# Patient Record
Sex: Female | Born: 1980 | Hispanic: Yes | Marital: Married
Health system: Southern US, Community
[De-identification: ages and names within clinical notes are randomized; demographics above are authoritative.]

---

## 2008-08-07 ENCOUNTER — Ambulatory Visit: Payer: Self-pay | Admitting: Certified Nurse Midwife

## 2009-02-19 ENCOUNTER — Emergency Department: Payer: Self-pay | Admitting: Emergency Medicine

## 2010-01-29 ENCOUNTER — Emergency Department: Payer: Self-pay | Admitting: Emergency Medicine

## 2011-07-13 ENCOUNTER — Emergency Department: Payer: Self-pay | Admitting: Emergency Medicine

## 2011-07-13 LAB — URINALYSIS, COMPLETE
Blood: NEGATIVE
Ketone: NEGATIVE
Ph: 6 (ref 4.5–8.0)
Protein: NEGATIVE
RBC,UR: 3 /HPF (ref 0–5)
Specific Gravity: 1.025 (ref 1.003–1.030)
WBC UR: 7 /HPF (ref 0–5)

## 2011-07-13 LAB — WET PREP, GENITAL

## 2011-07-13 LAB — PREGNANCY, URINE: Pregnancy Test, Urine: NEGATIVE m[IU]/mL

## 2011-11-07 ENCOUNTER — Emergency Department: Payer: Self-pay | Admitting: Emergency Medicine

## 2012-05-29 ENCOUNTER — Emergency Department: Payer: Self-pay | Admitting: Emergency Medicine

## 2012-05-29 LAB — URINALYSIS, COMPLETE
Bacteria: NONE SEEN
Blood: NEGATIVE
Glucose,UR: NEGATIVE mg/dL (ref 0–75)
Ketone: NEGATIVE
Nitrite: NEGATIVE
Ph: 6 (ref 4.5–8.0)
Protein: NEGATIVE
Specific Gravity: 1.026 (ref 1.003–1.030)
WBC UR: 2 /HPF (ref 0–5)

## 2012-05-29 LAB — CBC
HCT: 39 % (ref 35.0–47.0)
HGB: 13.2 g/dL (ref 12.0–16.0)
MCH: 31.5 pg (ref 26.0–34.0)
MCHC: 33.8 g/dL (ref 32.0–36.0)
RDW: 12.9 % (ref 11.5–14.5)
WBC: 9.9 10*3/uL (ref 3.6–11.0)

## 2012-05-29 LAB — COMPREHENSIVE METABOLIC PANEL
Anion Gap: 9 (ref 7–16)
BUN: 14 mg/dL (ref 7–18)
Bilirubin,Total: 0.4 mg/dL (ref 0.2–1.0)
Chloride: 107 mmol/L (ref 98–107)
Co2: 23 mmol/L (ref 21–32)
Creatinine: 0.79 mg/dL (ref 0.60–1.30)
EGFR (African American): >60
EGFR (Non-African Amer.): >60
SGOT(AST): 16 U/L (ref 15–37)
SGPT (ALT): 16 U/L (ref 12–78)

## 2012-05-29 LAB — LIPASE, BLOOD: Lipase: 126 U/L (ref 73–393)

## 2016-08-29 ENCOUNTER — Encounter: Payer: Self-pay | Admitting: Emergency Medicine

## 2016-08-29 ENCOUNTER — Emergency Department
Admission: EM | Admit: 2016-08-29 | Discharge: 2016-08-29 | Disposition: A | Discharge status: Home / Self Care | Payer: Self-pay | Attending: Emergency Medicine | Admitting: Emergency Medicine

## 2016-08-29 ENCOUNTER — Emergency Department: Payer: Self-pay

## 2016-08-29 DIAGNOSIS — J40 Bronchitis, not specified as acute or chronic: Secondary | ICD-10-CM

## 2016-08-29 DIAGNOSIS — J209 Acute bronchitis, unspecified: Secondary | ICD-10-CM | POA: Insufficient documentation

## 2016-08-29 MED ORDER — AZITHROMYCIN 250 MG PO TABS: ORAL_TABLET | ORAL | 0 refills | Status: AC

## 2016-08-29 MED ORDER — AZITHROMYCIN 500 MG PO TABS
500.0000 mg | ORAL_TABLET | Freq: Once | ORAL | Status: AC
  Administered 2016-08-29: 500 mg via ORAL
  Filled 2016-08-29: qty 1

## 2016-08-29 MED ORDER — ALBUTEROL SULFATE (2.5 MG/3ML) 0.083% IN NEBU
5.0000 mg | INHALATION_SOLUTION | Freq: Once | RESPIRATORY_TRACT | Status: DC
  Filled 2016-08-29: qty 6

## 2016-08-29 MED ORDER — IPRATROPIUM-ALBUTEROL 0.5-2.5 (3) MG/3ML IN SOLN
3.0000 mL | Freq: Once | RESPIRATORY_TRACT | Status: AC
  Administered 2016-08-29: 3 mL via RESPIRATORY_TRACT
  Filled 2016-08-29: qty 3

## 2016-08-29 MED ORDER — METHYLPREDNISOLONE 4 MG PO TBPK: ORAL_TABLET | ORAL | 0 refills | Status: AC

## 2016-08-29 MED ORDER — ALBUTEROL SULFATE HFA 108 (90 BASE) MCG/ACT IN AERS: 2.0000 | INHALATION_SPRAY | Freq: Four times a day (QID) | RESPIRATORY_TRACT | 2 refills | Status: AC | PRN

## 2016-08-29 MED ORDER — HYDROCOD POLST-CPM POLST ER 10-8 MG/5ML PO SUER
5.0000 mL | Freq: Once | ORAL | Status: AC
  Administered 2016-08-29: 5 mL via ORAL
  Filled 2016-08-29: qty 5

## 2016-08-29 MED ORDER — PREDNISONE 20 MG PO TABS
60.0000 mg | ORAL_TABLET | Freq: Once | ORAL | Status: AC
  Administered 2016-08-29: 60 mg via ORAL
  Filled 2016-08-29: qty 3

## 2016-08-29 NOTE — ED Triage Notes (Signed)
Pt ambulatory to triage with note shortness of breath. Wheelchair offered but refused by patient. Pt reports started last night with cough then shortness of breath and wheezing. Pt denies hx of asthma or other breathing problems. Pt having difficulty talking in full sentences at this time.Noted dry cough.  Pt taken to Rm 15.

## 2016-08-29 NOTE — ED Provider Notes (Signed)
[facility] Emergency Department Provider Note        Time seen: ----------------------------------------- 7:47 PM on 08/29/2016 -----------------------------------------    I have reviewed the triage vital signs and the nursing notes.   HISTORY  Chief Complaint Shortness of Breath and Cough    HPI Melissa Conner is a 36 y.o. female who presents to ER with shortness of breath. Patient reports last night she had cough with shortness of breath and wheezing. She denies a history of asthma or other breathing problems. She was noted to have a dry cough on arrival.Patient states she was out in the cold weather yesterday at a soccer game. Symptoms have persisted since then.   No past medical history on file.  There are no active problems to display for this patient.   No past surgical history on file.  Allergies Patient has no known allergies.  Social History Social History  Substance Use Topics  . Smoking status: Not on file  . Smokeless tobacco: Not on file  . Alcohol use Not on file    Review of Systems Constitutional: Negative for fever. Cardiovascular: Negative for chest pain. Respiratory: Positive shortness of breath and cough Gastrointestinal: Negative for abdominal pain, vomiting and diarrhea. Musculoskeletal: Negative for back pain. Skin: Negative for rash. Neurological: Negative for headaches, focal weakness or numbness.  10-point ROS otherwise negative.  ____________________________________________   PHYSICAL EXAM:  VITAL SIGNS: ED Triage Vitals  Enc Vitals Group     BP 08/29/16 1926 127/65     Pulse Rate 08/29/16 1926 (!) 118     Resp 08/29/16 1926 20     Temp 08/29/16 1926 99 F (37.2 C)     Temp Source 08/29/16 1926 Oral     SpO2 08/29/16 1926 96 %     Weight 08/29/16 1927 155 lb (70.3 kg)     Height 08/29/16 1927 5\' 1"  (1.549 m)     Head Circumference --      Peak Flow --      Pain Score 08/29/16 1928 8    Pain Loc --      Pain Edu? --      Excl. in GC? --     Constitutional: Alert and oriented. Well appearing and in no distress. Eyes: Conjunctivae are normal. PERRL. Normal extraocular movements. ENT   Head: Normocephalic and atraumatic.   Nose: No congestion/rhinnorhea.   Mouth/Throat: Mucous membranes are moist.   Neck: No stridor. Cardiovascular: Normal rate, regular rhythm. No murmurs, rubs, or gallops. Respiratory: Mild expiratory wheezing Gastrointestinal: Soft and nontender. Normal bowel sounds Musculoskeletal: Nontender with normal range of motion in all extremities. No lower extremity tenderness nor edema. Neurologic:  Normal speech and language. No gross focal neurologic deficits are appreciated.  Skin:  Skin is warm, dry and intact. No rash noted. Psychiatric: Mood and affect are normal. Speech and behavior are normal.  ____________________________________________  EKG: Interpreted by me. 106 bpm, normal PR interval, normal QRS, normal QT, normal axis.  ____________________________________________  ED COURSE:  Pertinent labs & imaging results that were available during my care of the patient were reviewed by me and considered in my medical decision making (see chart for details). Patient presents to the ER with bronchitic symptoms. We will assess with basic imaging, she'll receive DuoNeb since steroids.   Procedures ____________________________________________   RADIOLOGY Images were viewed by me  Chest x-ray Is unremarkable ____________________________________________  FINAL ASSESSMENT AND PLAN  Acute bronchitis  Plan: Patient with imaging as dictated  above. Patient presented to ER with bronchospasm. She was improved after tests based, DuoNeb and steroids. She'll be discharged with albuterol, steroids and a Z-Pak. She is stable for outpatient follow-up at this time.   Emily Filbert, MD   Note: This note was generated in part or whole  with voice recognition software. Voice recognition is usually quite accurate but there are transcription errors that can and very often do occur. I apologize for any typographical errors that were not detected and corrected.     Emily Filbert, MD 08/29/16 2116

## 2018-10-16 IMAGING — CR DG CHEST 2V
2 series · 2 of 2 positions shown · non-contrast
Comparison: None.

CLINICAL DATA: 36-year-old female with cough and shortness of
breath.

EXAM:
CHEST  2 VIEW

[chest pa]
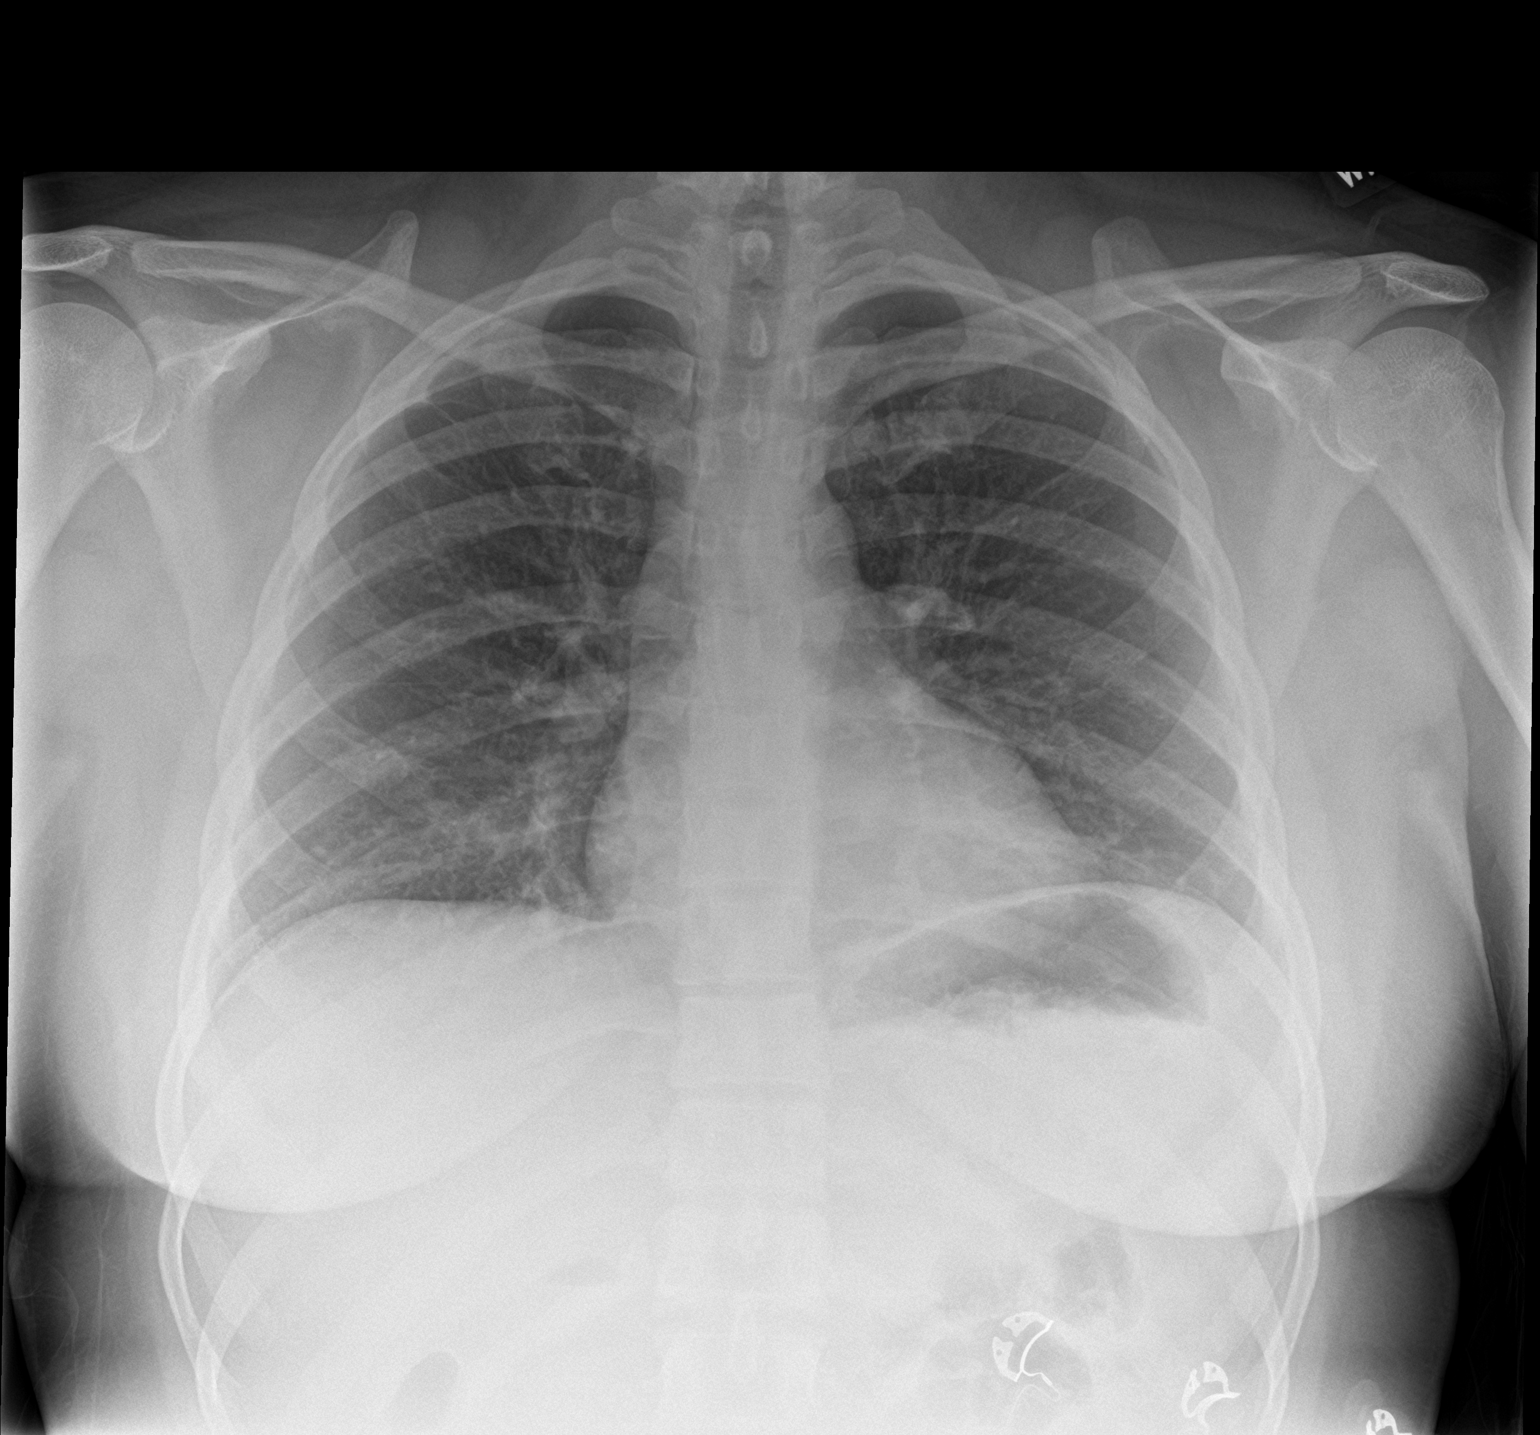

[chest lat]
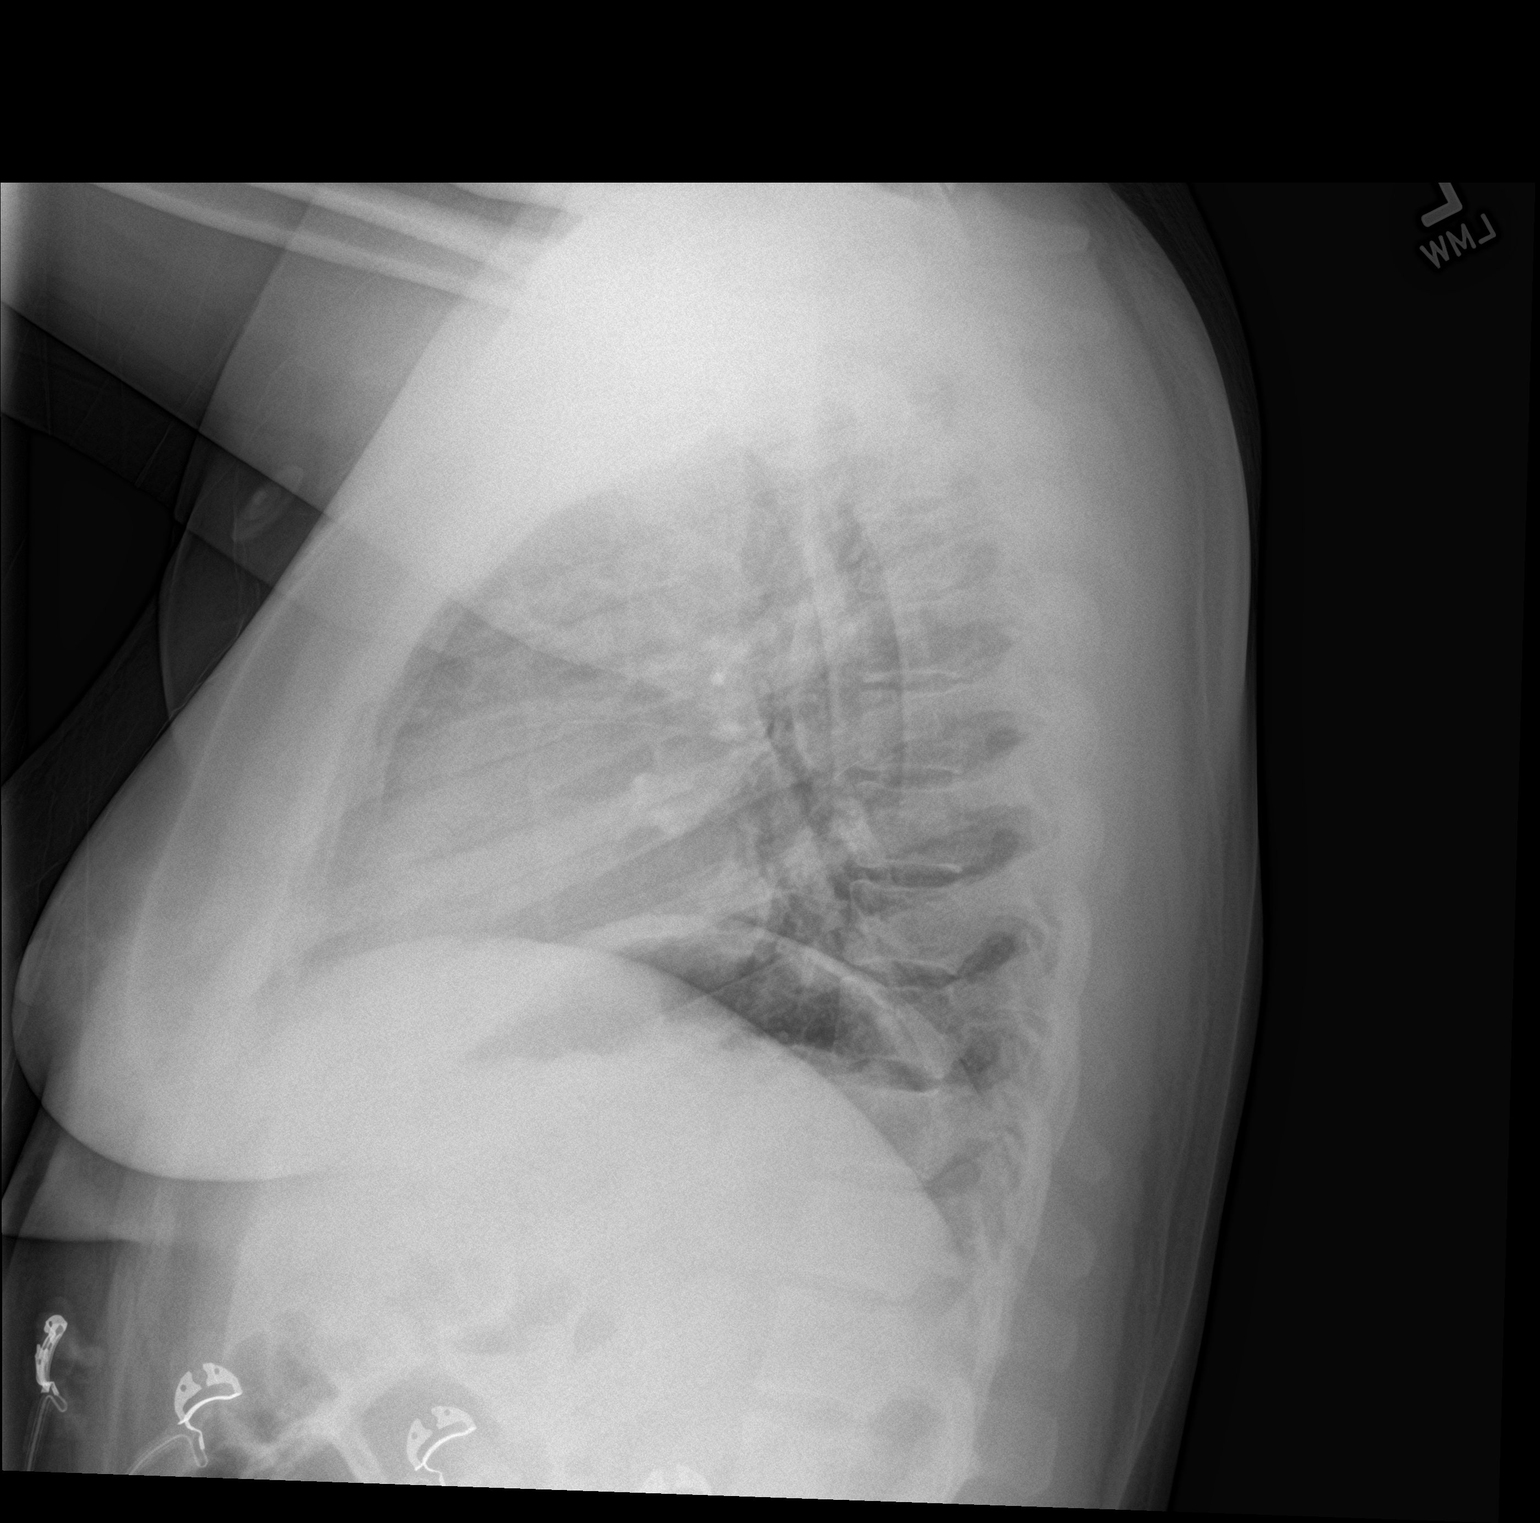

[2 of 2 positions shown; findings below may reference images not displayed]

FINDINGS: There is shallow inspiration. No focal consolidation, pleural
effusion, or pneumothorax. The cardiac silhouette is within normal
limits. No acute osseous pathology identified.
IMPRESSION: No acute cardiopulmonary process.

## 2018-12-25 ENCOUNTER — Telehealth: Payer: Self-pay

## 2018-12-25 DIAGNOSIS — Z20822 Contact with and (suspected) exposure to covid-19: Secondary | ICD-10-CM

## 2018-12-25 NOTE — Telephone Encounter (Signed)
Jake Samples, PA-C called from [facility] in [place] to refer the patient for covid testing. I called the patient and advised of the referral, she verbalized understanding. Appointment scheduled for tomorrow, 12/26/18 at 69 at [facility], advised of location and to wear a mask for everyone in the vehicle, she verbalized understanding. Order placed.  Phone: [phone] Fax: [phone]

## 2018-12-26 ENCOUNTER — Other Ambulatory Visit: Payer: Self-pay

## 2018-12-26 DIAGNOSIS — Z20822 Contact with and (suspected) exposure to covid-19: Secondary | ICD-10-CM

## 2018-12-30 LAB — NOVEL CORONAVIRUS, NAA: SARS-CoV-2, NAA: DETECTED — AB

## 2019-02-12 ENCOUNTER — Telehealth: Payer: Self-pay | Admitting: Physician Assistant

## 2019-02-12 ENCOUNTER — Encounter: Payer: Self-pay | Admitting: Physician Assistant

## 2019-02-12 DIAGNOSIS — Z20828 Contact with and (suspected) exposure to other viral communicable diseases: Secondary | ICD-10-CM

## 2019-02-12 DIAGNOSIS — R059 Cough, unspecified: Secondary | ICD-10-CM

## 2019-02-12 DIAGNOSIS — Z20822 Contact with and (suspected) exposure to covid-19: Secondary | ICD-10-CM

## 2019-02-12 DIAGNOSIS — R05 Cough: Secondary | ICD-10-CM

## 2019-02-12 NOTE — Progress Notes (Signed)
E-Visit for Corona Virus Screening   Your current symptoms could be consistent with the coronavirus.  Many health care providers can now test patients at their office but not all are.  [facility] has multiple testing sites. For information on our COVID testing locations and hours go to [email or web address]  Please quarantine yourself while awaiting your test results.  We are enrolling you in our MyChart Home [facility] for COVID19 . Daily you will receive a questionnaire within the MyChart website. Our COVID 19 response team willl be monitoriing your responses daily.  COVID 19 Testing Locations (Monday - Friday, 8 a.m. - 3:30 p.m.) [place]: [facility], [address], [place], [place]: [facility], [address], [place], [place] (entrance off [address]) [place]: [address], [place], [place] (across from [facility])  I have provided a work note     COVID-19 is a respiratory illness with symptoms that are similar to the flu. Symptoms are typically mild to moderate, but there have been cases of severe illness and death due to the virus. The following symptoms may appear 2-14 days after exposure: . Fever . Cough . Shortness of breath or difficulty breathing . Chills . Repeated shaking with chills . Muscle pain . Headache . Sore throat . New loss of taste or smell . Fatigue . Congestion or runny nose . Nausea or vomiting . Diarrhea  It is vitally important that if you feel that you have an infection such as this virus or any other virus that you stay home and away from places where you may spread it to others.  You should self-quarantine for 14 days if you have symptoms that could potentially be coronavirus or have been in close contact a with a person diagnosed with COVID-19 within the last 2 weeks. You should avoid contact with people age 65 and  older.   You should wear a mask or cloth face covering over your nose and mouth if you must be around other people or animals, including pets (even at home). Try to stay at least 6 feet away from other people. This will protect the people around you.  You can use medication such as A prescription cough medication called Tessalon Perles 100 mg. You may take 1-2 capsules every 8 hours as needed for cough  You may also take acetaminophen (Tylenol) as needed for fever.   Reduce your risk of any infection by using the same precautions used for avoiding the common cold or flu:  . Wash your hands often with soap and warm water for at least 20 seconds.  If soap and water are not readily available, use an alcohol-based hand sanitizer with at least 60% alcohol.  . If coughing or sneezing, cover your mouth and nose by coughing or sneezing into the elbow areas of your shirt or coat, into a tissue or into your sleeve (not your hands). . Avoid shaking hands with others and consider head nods or verbal greetings only. . Avoid touching your eyes, nose, or mouth with unwashed hands.  . Avoid close contact with people who are sick. . Avoid places or events with large numbers of people in one location, like concerts or sporting events. . Carefully consider travel plans you have or are making. . If you are planning any travel outside or inside the [place], visit the CDC's Travelers' Health webpage for the latest health notices. . If you have some symptoms   but not all symptoms, continue to monitor at home and seek medical attention if your symptoms worsen. . If you are having a medical emergency, call 911.  HOME CARE . Only take medications as instructed by your medical team. . Drink plenty of fluids and get plenty of rest. . A steam or ultrasonic humidifier can help if you have congestion.   GET HELP RIGHT AWAY IF YOU HAVE EMERGENCY WARNING SIGNS** FOR COVID-19. If you or someone is showing any of these signs seek  emergency medical care immediately. Call 911 or proceed to your closest emergency facility if: . You develop worsening high fever. . Trouble breathing . Bluish lips or face . Persistent pain or pressure in the chest . New confusion . Inability to wake or stay awake . You cough up blood. . Your symptoms become more severe  **This list is not all possible symptoms. Contact your medical provider for any symptoms that are sever or concerning to you.   MAKE SURE YOU   Understand these instructions.  Will watch your condition.  Will get help right away if you are not doing well or get worse.  Your e-visit answers were reviewed by a board certified advanced clinical practitioner to complete your personal care plan.  Depending on the condition, your plan could have included both over the counter or prescription medications.  If there is a problem please reply once you have received a response from your provider.  Your safety is important to us.  If you have drug allergies check your prescription carefully.    You can use MyChart to ask questions about today's visit, request a non-urgent call back, or ask for a work or school excuse for 24 hours related to this e-Visit. If it has been greater than 24 hours you will need to follow up with your provider, or enter a new e-Visit to address those concerns. You will get an e-mail in the next two days asking about your experience.  I hope that your e-visit has been valuable and will speed your recovery. Thank you for using e-visits.   I spent 5-10 minutes on review and completion of this note- Shareese Macha PAC
